# Patient Record
Sex: Female | Born: 1965 | Race: White | Hispanic: No | Marital: Married | Smoking: Never smoker
Health system: Southern US, Community
[De-identification: ages and names within clinical notes are randomized; demographics above are authoritative.]

## PROBLEM LIST (undated history)

## (undated) DIAGNOSIS — I10 Essential (primary) hypertension: Secondary | ICD-10-CM

## (undated) DIAGNOSIS — F419 Anxiety disorder, unspecified: Secondary | ICD-10-CM

## (undated) HISTORY — PX: BACK SURGERY: SHX140

## (undated) HISTORY — PX: ABDOMINAL HYSTERECTOMY: SHX81

---

## 2017-09-12 ENCOUNTER — Emergency Department: Payer: No Typology Code available for payment source

## 2017-09-12 ENCOUNTER — Emergency Department
Admission: EM | Admit: 2017-09-12 | Discharge: 2017-09-13 | Disposition: A | Discharge status: Home / Self Care | Payer: No Typology Code available for payment source | Attending: Emergency Medicine | Admitting: Emergency Medicine

## 2017-09-12 ENCOUNTER — Other Ambulatory Visit: Payer: Self-pay

## 2017-09-12 DIAGNOSIS — S199XXA Unspecified injury of neck, initial encounter: Secondary | ICD-10-CM | POA: Diagnosis present

## 2017-09-12 DIAGNOSIS — Y999 Unspecified external cause status: Secondary | ICD-10-CM | POA: Diagnosis not present

## 2017-09-12 DIAGNOSIS — R200 Anesthesia of skin: Secondary | ICD-10-CM | POA: Insufficient documentation

## 2017-09-12 DIAGNOSIS — S161XXA Strain of muscle, fascia and tendon at neck level, initial encounter: Secondary | ICD-10-CM | POA: Insufficient documentation

## 2017-09-12 DIAGNOSIS — Y939 Activity, unspecified: Secondary | ICD-10-CM | POA: Insufficient documentation

## 2017-09-12 DIAGNOSIS — R109 Unspecified abdominal pain: Secondary | ICD-10-CM | POA: Insufficient documentation

## 2017-09-12 DIAGNOSIS — S29012A Strain of muscle and tendon of back wall of thorax, initial encounter: Secondary | ICD-10-CM | POA: Diagnosis not present

## 2017-09-12 DIAGNOSIS — Y92410 Unspecified street and highway as the place of occurrence of the external cause: Secondary | ICD-10-CM | POA: Diagnosis not present

## 2017-09-12 DIAGNOSIS — S39012A Strain of muscle, fascia and tendon of lower back, initial encounter: Secondary | ICD-10-CM

## 2017-09-12 DIAGNOSIS — I10 Essential (primary) hypertension: Secondary | ICD-10-CM | POA: Diagnosis not present

## 2017-09-12 HISTORY — DX: Anxiety disorder, unspecified: F41.9

## 2017-09-12 HISTORY — DX: Essential (primary) hypertension: I10

## 2017-09-12 MED ORDER — KETOROLAC TROMETHAMINE 60 MG/2ML IM SOLN: 60.0000 mg | Freq: Once | INTRAMUSCULAR | Status: DC

## 2017-09-12 MED ORDER — OXYCODONE-ACETAMINOPHEN 5-325 MG PO TABS
2.0000 | ORAL_TABLET | Freq: Once | ORAL | Status: AC
  Administered 2017-09-12: 2 via ORAL

## 2017-09-12 MED ORDER — KETOROLAC TROMETHAMINE 30 MG/ML IJ SOLN
30.0000 mg | Freq: Once | INTRAMUSCULAR | Status: AC
  Administered 2017-09-12: 30 mg via INTRAVENOUS
  Filled 2017-09-12: qty 1

## 2017-09-12 MED ORDER — DIAZEPAM 5 MG PO TABS: 5.0000 mg | ORAL_TABLET | Freq: Three times a day (TID) | ORAL | 0 refills | Status: AC | PRN

## 2017-09-12 MED ORDER — DIAZEPAM 5 MG PO TABS
5.0000 mg | ORAL_TABLET | Freq: Once | ORAL | Status: AC
  Administered 2017-09-12: 5 mg via ORAL
  Filled 2017-09-12: qty 1

## 2017-09-12 MED ORDER — IOPAMIDOL (ISOVUE-300) INJECTION 61%
125.0000 mL | Freq: Once | INTRAVENOUS | Status: AC | PRN
  Administered 2017-09-12: 125 mL via INTRAVENOUS

## 2017-09-12 MED ORDER — OXYCODONE-ACETAMINOPHEN 5-325 MG PO TABS
ORAL_TABLET | ORAL | Status: AC
  Administered 2017-09-12: 2 via ORAL
  Filled 2017-09-12: qty 2
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
## 2017-09-12 NOTE — ED Notes (Signed)
Patient transported to CT 

## 2017-09-12 NOTE — Discharge Instructions (Signed)
May apply ice or heat to the areas where you have pain for 15 minutes every 2 hours to decrease pain.  You may take your home chronic pain medication as needed for pain.  Valium is for severe muscle spasms.  Do not drive within 8 hours of taking Valium.  These follow-up with your primary care physician, and return to the emergency department if you develop severe pain or any other symptoms concerning to you.
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
## 2018-06-17 DEATH — deceased
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
